# Patient Record
Sex: Male | Born: 1970
Health system: Southern US, Community
[De-identification: ages and names within clinical notes are randomized; demographics above are authoritative.]

---

## 2009-02-09 ENCOUNTER — Ambulatory Visit: Payer: Self-pay | Admitting: Internal Medicine

## 2009-03-09 LAB — COMPREHENSIVE METABOLIC PANEL
Albumin: 4.5 g/dL (ref 3.5–5.2)
BUN: 20 mg/dL (ref 6–23)
CO2: 27 mEq/L (ref 19–32)
Glucose, Bld: 95 mg/dL (ref 70–99)
Potassium: 4.3 mEq/L (ref 3.5–5.3)
Sodium: 140 mEq/L (ref 135–145)
Total Bilirubin: 0.6 mg/dL (ref 0.3–1.2)
Total Protein: 6.8 g/dL (ref 6.0–8.3)

## 2009-03-09 LAB — CBC WITH DIFFERENTIAL/PLATELET
BASO%: 0.2 % (ref 0.0–2.0)
HCT: 41.4 % (ref 38.4–49.9)
LYMPH%: 18.3 % (ref 14.0–49.0)
MCH: 31.6 pg (ref 27.2–33.4)
MCHC: 34.2 g/dL (ref 32.0–36.0)
MCV: 92.3 fL (ref 79.3–98.0)
MONO#: 0.6 10*3/uL (ref 0.1–0.9)
NEUT%: 68.8 % (ref 39.0–75.0)
Platelets: 133 10*3/uL — ABNORMAL LOW (ref 140–400)
WBC: 6.5 10*3/uL (ref 4.0–10.3)

## 2009-04-05 ENCOUNTER — Ambulatory Visit (HOSPITAL_COMMUNITY): Admission: RE | Admit: 2009-04-05 | Discharge: 2009-04-05 | Payer: Self-pay | Admitting: Internal Medicine

## 2009-04-06 ENCOUNTER — Ambulatory Visit: Payer: Self-pay | Admitting: Internal Medicine

## 2009-04-19 LAB — CBC WITH DIFFERENTIAL/PLATELET
Basophils Absolute: 0 10*3/uL (ref 0.0–0.1)
Eosinophils Absolute: 0.4 10*3/uL (ref 0.0–0.5)
HCT: 40.3 % (ref 38.4–49.9)
HGB: 14 g/dL (ref 13.0–17.1)
LYMPH%: 26.9 % (ref 14.0–49.0)
MCV: 91.6 fL (ref 79.3–98.0)
MONO%: 10 % (ref 0.0–14.0)
NEUT#: 2.6 10*3/uL (ref 1.5–6.5)
NEUT%: 54.6 % (ref 39.0–75.0)
Platelets: 133 10*3/uL — ABNORMAL LOW (ref 140–400)

## 2009-10-07 ENCOUNTER — Ambulatory Visit: Payer: Self-pay | Admitting: Internal Medicine

## 2010-11-20 ENCOUNTER — Encounter: Payer: Self-pay | Admitting: Internal Medicine

## 2013-12-31 ENCOUNTER — Telehealth: Payer: Self-pay | Admitting: Hematology & Oncology

## 2013-12-31 NOTE — Telephone Encounter (Signed)
Spoke w NEW PATIENT today to remind them of their appointment with Dr. Ennever. Also, advised them to bring all meds and insurance information. ° °

## 2014-01-01 ENCOUNTER — Encounter: Payer: Self-pay | Admitting: Hematology & Oncology

## 2014-01-01 ENCOUNTER — Ambulatory Visit (HOSPITAL_BASED_OUTPATIENT_CLINIC_OR_DEPARTMENT_OTHER): Payer: 59 | Admitting: Hematology & Oncology

## 2014-01-01 ENCOUNTER — Ambulatory Visit: Payer: 59

## 2014-01-01 ENCOUNTER — Telehealth: Payer: Self-pay | Admitting: Hematology & Oncology

## 2014-01-01 ENCOUNTER — Other Ambulatory Visit (HOSPITAL_BASED_OUTPATIENT_CLINIC_OR_DEPARTMENT_OTHER): Payer: 59 | Admitting: Lab

## 2014-01-01 VITALS — BP 127/65 | HR 59 | Temp 98.3°F | Resp 18 | Ht 72.0 in | Wt 186.0 lb

## 2014-01-01 DIAGNOSIS — D61818 Other pancytopenia: Secondary | ICD-10-CM

## 2014-01-01 DIAGNOSIS — D72819 Decreased white blood cell count, unspecified: Secondary | ICD-10-CM

## 2014-01-01 DIAGNOSIS — D696 Thrombocytopenia, unspecified: Secondary | ICD-10-CM

## 2014-01-01 LAB — CBC WITH DIFFERENTIAL (CANCER CENTER ONLY)
BASO#: 0 10*3/uL (ref 0.0–0.2)
BASO%: 0.3 % (ref 0.0–2.0)
EOS%: 2.7 % (ref 0.0–7.0)
Eosinophils Absolute: 0.1 10*3/uL (ref 0.0–0.5)
HEMATOCRIT: 42 % (ref 38.7–49.9)
HGB: 13.8 g/dL (ref 13.0–17.1)
LYMPH#: 1.1 10*3/uL (ref 0.9–3.3)
LYMPH%: 29.6 % (ref 14.0–48.0)
MCH: 30.8 pg (ref 28.0–33.4)
MCHC: 32.9 g/dL (ref 32.0–35.9)
MCV: 94 fL (ref 82–98)
MONO#: 0.5 10*3/uL (ref 0.1–0.9)
MONO%: 12.8 % (ref 0.0–13.0)
NEUT#: 2 10*3/uL (ref 1.5–6.5)
NEUT%: 54.6 % (ref 40.0–80.0)
Platelets: 117 10*3/uL — ABNORMAL LOW (ref 145–400)
RBC: 4.48 10*6/uL (ref 4.20–5.70)
RDW: 12.3 % (ref 11.1–15.7)
WBC: 3.7 10*3/uL — ABNORMAL LOW (ref 4.0–10.0)

## 2014-01-01 LAB — CMP (CANCER CENTER ONLY)
ALK PHOS: 42 U/L (ref 26–84)
ALT(SGPT): 23 U/L (ref 10–47)
AST: 29 U/L (ref 11–38)
Albumin: 4 g/dL (ref 3.3–5.5)
BUN: 13 mg/dL (ref 7–22)
CO2: 30 mEq/L (ref 18–33)
CREATININE: 1 mg/dL (ref 0.6–1.2)
Calcium: 9.3 mg/dL (ref 8.0–10.3)
Chloride: 104 mEq/L (ref 98–108)
GLUCOSE: 85 mg/dL (ref 73–118)
Potassium: 3.7 mEq/L (ref 3.3–4.7)
Sodium: 137 mEq/L (ref 128–145)
Total Bilirubin: 0.7 mg/dl (ref 0.20–1.60)
Total Protein: 7 g/dL (ref 6.4–8.1)

## 2014-01-01 LAB — CHCC SATELLITE - SMEAR

## 2014-01-01 NOTE — Progress Notes (Signed)
Referral MD  Reason for Referral: Thrombo-cytopenia and leukopenia   Chief Complaint  Patient presents with  . NEW PATIENT  : I'm here because my abnormal blood counts  HPI: Mr. Aaron Ortega is a very nice 43 year old gentleman. He works for [employer]. He is originally from [place]. Is on talking to him about [place] as I did my training in [place]   He had an insurance physical. Apparently blood work was abnormal. He's not yet heard what else was abnormal.  He saw his family doctor. Lab work was done. This was done in January. His white cell count was 3.7. Hemoglobin 14.4. Platelet count was 138.  Going through past lab work, and everything was normal about 45 years ago.  He's had no change in medications. He really does not take medications. He is does not smoke. He does have some alcohol on occasion. He has not traveled recently.  There is no past surgery. He's had no transfusions. There is no risk factors for HIV or hepatitis.  Is not a vegetarian. He's had no change in bowel or bladder habits. He's had no rashes. He's had no cough. There's no swollen lymph nodes. There is no joint swelling or redness.  Thank you is currently referred to the [facility] for evaluation.   No past medical history on file.:  No past surgical history on file.:  Current outpatient prescriptions:Multiple Vitamins-Minerals (MULTIVITAMIN PO), Take by mouth every morning., Disp: , Rfl: :  :  No Known Allergies:  No family history on file.:  History   Social History  . Marital Status: Single    Spouse Name: N/A    Number of Children: N/A  . Years of Education: N/A   Occupational History  . Not on file.   Social History Main Topics  . Smoking status: Never Smoker   . Smokeless tobacco: Never Used     Comment: never used tobacco  . Alcohol Use: Not on file  . Drug Use: Not on file  . Sexual Activity: Not on file   Other Topics Concern  . Not on file   Social  History Narrative  . No narrative on file  :  Pertinent items are noted in HPI.  Exam: @IPVITALS @  well-developed well-nourished gentleman. Vital signs are stable. He is afebrile. He has no adenopathy. Lungs are clear. Cardiac exam regular rate and rhythm. Skin no rashes. Abdomen soft. No palpable liver or spleen. Inguinal exam is negative. Back exam no tenderness over the spine. Skin exam shows some hyperpigmented lesions. He has seen a dermatologist. No malignant lesions have been removed. Extremities shows no clubbing cyanosis or edema. Oral exam is negative. Thyroid nonpalpable.    Recent Labs  01/01/14 1205  WBC 3.7*  HGB 13.8  HCT 42.0  PLT 117*    Recent Labs  01/01/14 1205  NA 137  K 3.7  CL 104  CO2 30  GLUCOSE 85  BUN 13  CREATININE 1.0  CALCIUM 9.3    Blood smear review: Normochromic normocytic red cells. There are no nucleated red cells. There are no teardrop cells. I see no schistocytes and there are no spherocytes. I see no rouleau formation. White cells appear normal in morphology maturation. There is no atypical lymphocytes. There are no hyper segmented polys. There is no immature myeloid cells. No blasts are noted. Platelets are mildly decreased. Platelets are well regulated. He has several large platelets  Pathology: None     Assessment and Plan: 43 year old gentleman.  He's been in excellent health. He has minimal leukopenia and mild thrombocytopenia.  I cannot find anything on his physical exam that would suggest an underlying malignancy. His blood smear certainly is unremarkable for any obvious bone marrow disorder. I cannot find anything that would suggest a hematologic malignancy. I don't see anything that really looks like myelo dysplasia. I certainly do not believe there is leukemia.  I don't find anything that would suggest a collagen vascular disease.  It is clearly possible that he may have mild immune thrombocytopenia. This would certainly  would be logical.  Again there is no risk factors for HIV or hepatitis.  He does enjoy alcohol. Once it was postulated that it might be an alcohol effect on the bone marrow. However this would be unusual.  I think we can just follow him along. He's asymptomatic. I think that we can just be conservative and see how things look.  I spent a good hour with him. I went over his lab work. I gave him  my recommendations.  I would like to see him back in 3 months.

## 2014-01-01 NOTE — Telephone Encounter (Signed)
Mailed [phone] schedule

## 2014-04-02 ENCOUNTER — Ambulatory Visit (HOSPITAL_BASED_OUTPATIENT_CLINIC_OR_DEPARTMENT_OTHER): Payer: 59 | Admitting: Hematology & Oncology

## 2014-04-02 ENCOUNTER — Encounter: Payer: Self-pay | Admitting: Hematology & Oncology

## 2014-04-02 ENCOUNTER — Other Ambulatory Visit (HOSPITAL_BASED_OUTPATIENT_CLINIC_OR_DEPARTMENT_OTHER): Payer: 59 | Admitting: Lab

## 2014-04-02 VITALS — BP 160/64 | HR 57 | Temp 97.9°F | Resp 18 | Ht 72.0 in | Wt 183.0 lb

## 2014-04-02 DIAGNOSIS — D693 Immune thrombocytopenic purpura: Secondary | ICD-10-CM

## 2014-04-02 DIAGNOSIS — D61818 Other pancytopenia: Secondary | ICD-10-CM

## 2014-04-02 DIAGNOSIS — D696 Thrombocytopenia, unspecified: Secondary | ICD-10-CM

## 2014-04-02 LAB — CBC WITH DIFFERENTIAL (CANCER CENTER ONLY)
BASO#: 0 10*3/uL (ref 0.0–0.2)
BASO%: 0.5 % (ref 0.0–2.0)
EOS%: 2.5 % (ref 0.0–7.0)
Eosinophils Absolute: 0.1 10*3/uL (ref 0.0–0.5)
HEMATOCRIT: 40.7 % (ref 38.7–49.9)
HEMOGLOBIN: 14 g/dL (ref 13.0–17.1)
LYMPH#: 1.3 10*3/uL (ref 0.9–3.3)
LYMPH%: 28.6 % (ref 14.0–48.0)
MCH: 31.9 pg (ref 28.0–33.4)
MCHC: 34.4 g/dL (ref 32.0–35.9)
MCV: 93 fL (ref 82–98)
MONO#: 0.5 10*3/uL (ref 0.1–0.9)
MONO%: 10.1 % (ref 0.0–13.0)
NEUT#: 2.6 10*3/uL (ref 1.5–6.5)
NEUT%: 58.3 % (ref 40.0–80.0)
Platelets: 135 10*3/uL — ABNORMAL LOW (ref 145–400)
RBC: 4.39 10*6/uL (ref 4.20–5.70)
RDW: 12 % (ref 11.1–15.7)
WBC: 4.4 10*3/uL (ref 4.0–10.0)

## 2014-04-02 LAB — CHCC SATELLITE - SMEAR

## 2014-04-02 NOTE — Progress Notes (Signed)
Hematology and Oncology Follow Up Visit  Aaron Ortega [id number] [birth year] 43 y.o. 04/02/2014   Principle Diagnosis:   Thrombocytopenia-likely mild chronic immune-based  Current Therapy:    Observation     Interim History:  Mr.  Aaron Ortega is back for his second office visit. We first saw him back in March. At that point in time, all his labs looked good. There is no obvious etiology for the thrombocytopenia. He has been a symptomatic. He's had no bleeding. There's been no bruising. He's had no cough. He's had no rashes. He has had no change in bowel or bladder habits.  The no weight loss or weight gain. His appetite has been good. He is working full-time.        Medications: Current outpatient prescriptions:Multiple Vitamins-Minerals (MULTIVITAMIN PO), Take by mouth every morning., Disp: , Rfl:   Allergies: No Known Allergies  Past Medical History, Surgical history, Social history, and Family History were reviewed and updated.  Review of Systems: As above  Physical Exam:  height is 6' (1.829 m) and weight is 183 lb (83.008 kg). His oral temperature is 97.9 F (36.6 C). His blood pressure is 160/64 and his pulse is 57. His respiration is 18.   Well-developed and well-nourished white male . Head and neck exam shows no ocular or oral lesions. He is no palpable cervical or super clavicular lymph nodes. Lungs are clear. Cardiac exam regular rate rhythm with no murmurs. Abdomen soft. There is no palpable liver or spleen. Back exam no tenderness over the spine ribs or hips. Skin exam no rashes. Neurological exam no focal findings. Lab Results  Component Value Date   WBC 4.4 04/02/2014   HGB 14.0 04/02/2014   HCT 40.7 04/02/2014   MCV 93 04/02/2014   PLT 135 Platelet count consistent in citrate* 04/02/2014     Chemistry      Component Value Date/Time   NA 137 01/01/2014 1205   NA 140 03/09/2009 0940   K 3.7 01/01/2014 1205   K 4.3 03/09/2009 0940   CL 104 01/01/2014 1205   CL 105 03/09/2009  0940   CO2 30 01/01/2014 1205   CO2 27 03/09/2009 0940   BUN 13 01/01/2014 1205   BUN 20 03/09/2009 0940   CREATININE 1.0 01/01/2014 1205   CREATININE 0.88 03/09/2009 0940      Component Value Date/Time   CALCIUM 9.3 01/01/2014 1205   CALCIUM 9.4 03/09/2009 0940   ALKPHOS 42 01/01/2014 1205   ALKPHOS 39 03/09/2009 0940   AST 29 01/01/2014 1205   AST 41* 03/09/2009 0940   ALT 23 01/01/2014 1205   ALT 31 03/09/2009 0940   BILITOT 0.70 01/01/2014 1205   BILITOT 0.6 03/09/2009 0940         Impression and Plan: Mr. Aaron Ortega is 43 year old gentleman. His mild thrombocytopenia. He is totally asymptomatic. His platelet count is better today. I just think that this will fluctuate.  I would think that he has a mild chronic immune cytopenia. He does not need any further intervention. He did not need a bone marrow test. I don't see a need for any scans.  I really think that we can let him go from the practice. If there are any problems in the future, I really would be more the happy to see him back.  I reviewed his lab work with him. He feels good about not having to come back. Volanda Napoleon, MD 6/4/20156:37 PM

## 2017-05-04 DIAGNOSIS — S76312A Strain of muscle, fascia and tendon of the posterior muscle group at thigh level, left thigh, initial encounter: Secondary | ICD-10-CM | POA: Diagnosis not present

## 2017-05-04 DIAGNOSIS — M9901 Segmental and somatic dysfunction of cervical region: Secondary | ICD-10-CM | POA: Diagnosis not present

## 2017-05-04 DIAGNOSIS — M9902 Segmental and somatic dysfunction of thoracic region: Secondary | ICD-10-CM | POA: Diagnosis not present

## 2017-06-14 DIAGNOSIS — M9903 Segmental and somatic dysfunction of lumbar region: Secondary | ICD-10-CM | POA: Diagnosis not present

## 2017-06-14 DIAGNOSIS — M9901 Segmental and somatic dysfunction of cervical region: Secondary | ICD-10-CM | POA: Diagnosis not present

## 2017-06-14 DIAGNOSIS — M9902 Segmental and somatic dysfunction of thoracic region: Secondary | ICD-10-CM | POA: Diagnosis not present

## 2017-06-27 DIAGNOSIS — R509 Fever, unspecified: Secondary | ICD-10-CM | POA: Diagnosis not present

## 2017-06-27 DIAGNOSIS — J029 Acute pharyngitis, unspecified: Secondary | ICD-10-CM | POA: Diagnosis not present

## 2017-07-12 DIAGNOSIS — M9901 Segmental and somatic dysfunction of cervical region: Secondary | ICD-10-CM | POA: Diagnosis not present

## 2017-07-12 DIAGNOSIS — M9902 Segmental and somatic dysfunction of thoracic region: Secondary | ICD-10-CM | POA: Diagnosis not present

## 2017-07-12 DIAGNOSIS — M9903 Segmental and somatic dysfunction of lumbar region: Secondary | ICD-10-CM | POA: Diagnosis not present

## 2017-08-16 DIAGNOSIS — M9901 Segmental and somatic dysfunction of cervical region: Secondary | ICD-10-CM | POA: Diagnosis not present

## 2017-08-16 DIAGNOSIS — M9903 Segmental and somatic dysfunction of lumbar region: Secondary | ICD-10-CM | POA: Diagnosis not present

## 2017-08-16 DIAGNOSIS — M9902 Segmental and somatic dysfunction of thoracic region: Secondary | ICD-10-CM | POA: Diagnosis not present

## 2017-09-18 DIAGNOSIS — M9902 Segmental and somatic dysfunction of thoracic region: Secondary | ICD-10-CM | POA: Diagnosis not present

## 2017-09-18 DIAGNOSIS — M9901 Segmental and somatic dysfunction of cervical region: Secondary | ICD-10-CM | POA: Diagnosis not present

## 2017-09-18 DIAGNOSIS — M9903 Segmental and somatic dysfunction of lumbar region: Secondary | ICD-10-CM | POA: Diagnosis not present

## 2017-10-18 DIAGNOSIS — M9902 Segmental and somatic dysfunction of thoracic region: Secondary | ICD-10-CM | POA: Diagnosis not present

## 2017-10-18 DIAGNOSIS — M9901 Segmental and somatic dysfunction of cervical region: Secondary | ICD-10-CM | POA: Diagnosis not present

## 2017-10-18 DIAGNOSIS — M9903 Segmental and somatic dysfunction of lumbar region: Secondary | ICD-10-CM | POA: Diagnosis not present

## 2018-02-02 DIAGNOSIS — J1189 Influenza due to unidentified influenza virus with other manifestations: Secondary | ICD-10-CM | POA: Diagnosis not present

## 2018-03-06 DIAGNOSIS — J309 Allergic rhinitis, unspecified: Secondary | ICD-10-CM | POA: Diagnosis not present

## 2018-03-06 DIAGNOSIS — R030 Elevated blood-pressure reading, without diagnosis of hypertension: Secondary | ICD-10-CM | POA: Diagnosis not present

## 2018-03-29 DIAGNOSIS — Z0001 Encounter for general adult medical examination with abnormal findings: Secondary | ICD-10-CM | POA: Diagnosis not present

## 2018-03-29 DIAGNOSIS — R030 Elevated blood-pressure reading, without diagnosis of hypertension: Secondary | ICD-10-CM | POA: Diagnosis not present

## 2018-04-02 DIAGNOSIS — D2239 Melanocytic nevi of other parts of face: Secondary | ICD-10-CM | POA: Diagnosis not present

## 2018-04-02 DIAGNOSIS — L570 Actinic keratosis: Secondary | ICD-10-CM | POA: Diagnosis not present

## 2018-04-12 DIAGNOSIS — L570 Actinic keratosis: Secondary | ICD-10-CM | POA: Diagnosis not present

## 2018-07-18 DIAGNOSIS — H5213 Myopia, bilateral: Secondary | ICD-10-CM | POA: Diagnosis not present

## 2018-09-24 DIAGNOSIS — M9902 Segmental and somatic dysfunction of thoracic region: Secondary | ICD-10-CM | POA: Diagnosis not present

## 2018-09-24 DIAGNOSIS — M9901 Segmental and somatic dysfunction of cervical region: Secondary | ICD-10-CM | POA: Diagnosis not present

## 2018-09-24 DIAGNOSIS — M9903 Segmental and somatic dysfunction of lumbar region: Secondary | ICD-10-CM | POA: Diagnosis not present

## 2019-01-28 DIAGNOSIS — Z713 Dietary counseling and surveillance: Secondary | ICD-10-CM | POA: Diagnosis not present

## 2019-03-03 DIAGNOSIS — Z713 Dietary counseling and surveillance: Secondary | ICD-10-CM | POA: Diagnosis not present

## 2019-03-04 DIAGNOSIS — D225 Melanocytic nevi of trunk: Secondary | ICD-10-CM | POA: Diagnosis not present

## 2019-03-04 DIAGNOSIS — L905 Scar conditions and fibrosis of skin: Secondary | ICD-10-CM | POA: Diagnosis not present

## 2019-03-04 DIAGNOSIS — L280 Lichen simplex chronicus: Secondary | ICD-10-CM | POA: Diagnosis not present

## 2019-03-04 DIAGNOSIS — L814 Other melanin hyperpigmentation: Secondary | ICD-10-CM | POA: Diagnosis not present

## 2019-03-04 DIAGNOSIS — D485 Neoplasm of uncertain behavior of skin: Secondary | ICD-10-CM | POA: Diagnosis not present

## 2019-03-04 DIAGNOSIS — L578 Other skin changes due to chronic exposure to nonionizing radiation: Secondary | ICD-10-CM | POA: Diagnosis not present

## 2019-03-07 DIAGNOSIS — M9903 Segmental and somatic dysfunction of lumbar region: Secondary | ICD-10-CM | POA: Diagnosis not present

## 2019-03-07 DIAGNOSIS — M9901 Segmental and somatic dysfunction of cervical region: Secondary | ICD-10-CM | POA: Diagnosis not present

## 2019-03-07 DIAGNOSIS — M9902 Segmental and somatic dysfunction of thoracic region: Secondary | ICD-10-CM | POA: Diagnosis not present

## 2019-03-07 DIAGNOSIS — M9905 Segmental and somatic dysfunction of pelvic region: Secondary | ICD-10-CM | POA: Diagnosis not present

## 2019-09-24 ENCOUNTER — Other Ambulatory Visit: Payer: Self-pay

## 2019-09-24 ENCOUNTER — Emergency Department (HOSPITAL_COMMUNITY): Payer: 59

## 2019-09-24 ENCOUNTER — Encounter (HOSPITAL_COMMUNITY): Payer: Self-pay | Admitting: Emergency Medicine

## 2019-09-24 ENCOUNTER — Emergency Department (HOSPITAL_COMMUNITY)
Admission: EM | Admit: 2019-09-24 | Discharge: 2019-09-24 | Disposition: A | Discharge status: Home / Self Care | Payer: 59 | Attending: Emergency Medicine | Admitting: Emergency Medicine

## 2019-09-24 DIAGNOSIS — S2241XA Multiple fractures of ribs, right side, initial encounter for closed fracture: Secondary | ICD-10-CM

## 2019-09-24 DIAGNOSIS — D3501 Benign neoplasm of right adrenal gland: Secondary | ICD-10-CM | POA: Diagnosis not present

## 2019-09-24 DIAGNOSIS — S2241XD Multiple fractures of ribs, right side, subsequent encounter for fracture with routine healing: Secondary | ICD-10-CM | POA: Diagnosis not present

## 2019-09-24 DIAGNOSIS — J90 Pleural effusion, not elsewhere classified: Secondary | ICD-10-CM | POA: Diagnosis not present

## 2019-09-24 LAB — BASIC METABOLIC PANEL
Anion gap: 9 (ref 5–15)
BUN: 14 mg/dL (ref 6–20)
CO2: 25 mmol/L (ref 22–32)
Calcium: 8.6 mg/dL — ABNORMAL LOW (ref 8.9–10.3)
Chloride: 104 mmol/L (ref 98–111)
Creatinine, Ser: 0.9 mg/dL (ref 0.61–1.24)
GFR calc Af Amer: >60 mL/min (ref >60)
GFR calc non Af Amer: >60 mL/min (ref >60)
Glucose, Bld: 108 mg/dL — ABNORMAL HIGH (ref 70–99)
Potassium: 4 mmol/L (ref 3.5–5.1)
Sodium: 138 mmol/L (ref 135–145)

## 2019-09-24 LAB — CBC WITH DIFFERENTIAL/PLATELET
Abs Immature Granulocytes: 0.02 10*3/uL (ref 0.00–0.07)
Basophils Absolute: 0.1 10*3/uL (ref 0.0–0.1)
Basophils Relative: 1 %
Eosinophils Absolute: 1.5 10*3/uL — ABNORMAL HIGH (ref 0.0–0.5)
Eosinophils Relative: 21 %
HCT: 48.7 % (ref 39.0–52.0)
Hemoglobin: 15.7 g/dL (ref 13.0–17.0)
Immature Granulocytes: 0 %
Lymphocytes Relative: 14 %
Lymphs Abs: 1 10*3/uL (ref 0.7–4.0)
MCH: 30.6 pg (ref 26.0–34.0)
MCHC: 32.2 g/dL (ref 30.0–36.0)
MCV: 94.9 fL (ref 80.0–100.0)
Monocytes Absolute: 1 10*3/uL (ref 0.1–1.0)
Monocytes Relative: 14 %
Neutro Abs: 3.7 10*3/uL (ref 1.7–7.7)
Neutrophils Relative %: 50 %
Platelets: 227 10*3/uL (ref 150–400)
RBC: 5.13 MIL/uL (ref 4.22–5.81)
RDW: 12.3 % (ref 11.5–15.5)
WBC: 7.3 10*3/uL (ref 4.0–10.5)
nRBC: 0 % (ref 0.0–0.2)

## 2019-09-24 MED ORDER — IOHEXOL 300 MG/ML  SOLN
75.0000 mL | Freq: Once | INTRAMUSCULAR | Status: AC | PRN
  Administered 2019-09-24: 75 mL via INTRAVENOUS

## 2019-09-24 MED ORDER — IOHEXOL 300 MG/ML  SOLN
100.0000 mL | Freq: Once | INTRAMUSCULAR | Status: AC | PRN
  Administered 2019-09-24: 100 mL via INTRAVENOUS

## 2019-09-24 NOTE — ED Notes (Signed)
Pt returned to CT at this time
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
## 2019-09-24 NOTE — Discharge Instructions (Signed)
You were seen in the emergency department today with enlarging fluid collection in the right lung.  At this point, this will require surgery for removal.  I have listed the phone number for Dr. Caffie Pinto.  Please call the office first thing Monday to schedule an appointment.  He will make the office staff aware that you will be calling to schedule and they can get you in to schedule the surgery.  If your symptoms significantly worsen in the coming days you should return to the emergency department.   The CT scan also showed a benign lesion in the liver.  We also found what appears to be an adrenal adenoma lesion above the kidney.  The radiologist is recommending

## 2019-09-30 ENCOUNTER — Other Ambulatory Visit: Payer: Self-pay | Admitting: Surgery

## 2019-09-30 DIAGNOSIS — J90 Pleural effusion, not elsewhere classified: Secondary | ICD-10-CM

## 2019-10-01 ENCOUNTER — Ambulatory Visit
Admission: RE | Admit: 2019-10-01 | Discharge: 2019-10-01 | Disposition: A | Discharge status: Home / Self Care | Payer: 59 | Source: Ambulatory Visit | Attending: Surgery | Admitting: Surgery

## 2019-10-01 ENCOUNTER — Encounter: Payer: Self-pay | Admitting: Surgery

## 2019-10-01 ENCOUNTER — Other Ambulatory Visit: Payer: Self-pay

## 2019-10-01 ENCOUNTER — Other Ambulatory Visit: Payer: Self-pay | Admitting: *Deleted

## 2019-10-01 ENCOUNTER — Institutional Professional Consult (permissible substitution): Payer: 59 | Admitting: Surgery

## 2019-10-01 VITALS — BP 132/90 | HR 100 | Temp 97.8°F | Resp 20 | Ht 72.0 in | Wt 211.0 lb

## 2019-10-01 DIAGNOSIS — J90 Pleural effusion, not elsewhere classified: Secondary | ICD-10-CM | POA: Diagnosis not present
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
## 2019-10-04 ENCOUNTER — Other Ambulatory Visit (HOSPITAL_COMMUNITY)
Admission: RE | Admit: 2019-10-04 | Discharge: 2019-10-04 | Disposition: A | Discharge status: Home / Self Care | Payer: 59 | Source: Ambulatory Visit | Attending: Surgery | Admitting: Surgery

## 2019-10-04 DIAGNOSIS — J90 Pleural effusion, not elsewhere classified: Secondary | ICD-10-CM

## 2019-10-04 DIAGNOSIS — Z01812 Encounter for preprocedural laboratory examination: Secondary | ICD-10-CM | POA: Insufficient documentation

## 2019-10-04 DIAGNOSIS — U071 COVID-19: Secondary | ICD-10-CM | POA: Diagnosis not present

## 2019-10-05 LAB — SARS CORONAVIRUS 2 (TAT 6-24 HRS): SARS Coronavirus 2: POSITIVE — AB

## 2019-10-05 NOTE — Progress Notes (Signed)
Dr. Orvan Seen from the on-call service for Dr. Gilford Raid made aware of pt's positive covid result.

## 2019-10-06 ENCOUNTER — Other Ambulatory Visit: Payer: Self-pay | Admitting: Surgery

## 2019-10-06 DIAGNOSIS — J90 Pleural effusion, not elsewhere classified: Secondary | ICD-10-CM

## 2019-10-07 ENCOUNTER — Inpatient Hospital Stay (HOSPITAL_COMMUNITY): Admission: RE | Admit: 2019-10-07 | Payer: 59 | Source: Ambulatory Visit

## 2019-10-15 ENCOUNTER — Other Ambulatory Visit: Payer: Self-pay | Admitting: Surgery

## 2019-10-15 DIAGNOSIS — Z9889 Other specified postprocedural states: Secondary | ICD-10-CM

## 2019-10-17 ENCOUNTER — Other Ambulatory Visit (HOSPITAL_COMMUNITY): Payer: 59

## 2019-10-18 ENCOUNTER — Other Ambulatory Visit (HOSPITAL_COMMUNITY)
Admission: RE | Admit: 2019-10-18 | Discharge: 2019-10-18 | Disposition: A | Discharge status: Home / Self Care | Payer: 59 | Source: Ambulatory Visit | Attending: Surgery | Admitting: Surgery

## 2019-10-18 DIAGNOSIS — Z01812 Encounter for preprocedural laboratory examination: Secondary | ICD-10-CM | POA: Insufficient documentation

## 2019-10-18 DIAGNOSIS — Z20828 Contact with and (suspected) exposure to other viral communicable diseases: Secondary | ICD-10-CM | POA: Insufficient documentation

## 2019-10-18 LAB — SARS CORONAVIRUS 2 (TAT 6-24 HRS): SARS Coronavirus 2: NEGATIVE

## 2019-10-21 ENCOUNTER — Other Ambulatory Visit: Payer: Self-pay

## 2019-10-21 ENCOUNTER — Ambulatory Visit (HOSPITAL_COMMUNITY)
Admission: RE | Admit: 2019-10-21 | Discharge: 2019-10-21 | Disposition: A | Discharge status: Home / Self Care | Payer: 59 | Source: Ambulatory Visit | Attending: Surgery | Admitting: Surgery

## 2019-10-21 ENCOUNTER — Other Ambulatory Visit: Payer: Self-pay | Admitting: Surgery

## 2019-10-21 DIAGNOSIS — J90 Pleural effusion, not elsewhere classified: Secondary | ICD-10-CM

## 2019-10-21 HISTORY — PX: IR THORACENTESIS ASP PLEURAL SPACE W/IMG GUIDE: IMG5380

## 2019-10-21 MED ORDER — LIDOCAINE HCL 1 % IJ SOLN
INTRAMUSCULAR | Status: AC
  Filled 2019-10-21: qty 20

## 2019-10-21 MED ORDER — LIDOCAINE HCL 1 % IJ SOLN
INTRAMUSCULAR | Status: DC | PRN
  Administered 2019-10-21: 10 mL

## 2019-10-21 NOTE — Procedures (Signed)
PROCEDURE SUMMARY:  Successful US guided diagnostic and therapeutic right thoracentesis. Yielded 250 mL of blood-tinged fluid. Pt tolerated procedure well. No immediate complications.  Specimen was sent for labs. CXR ordered.  EBL < 5 mL  Docia Barrier PA-C 10/21/2019 2:59 PM

## 2019-10-22 ENCOUNTER — Ambulatory Visit: Payer: 59 | Admitting: Surgery

## 2019-10-22 LAB — CYTOLOGY - NON PAP

## 2019-11-10 ENCOUNTER — Other Ambulatory Visit: Payer: Self-pay | Admitting: Family Medicine

## 2019-11-10 DIAGNOSIS — D1803 Hemangioma of intra-abdominal structures: Secondary | ICD-10-CM

## 2019-11-10 DIAGNOSIS — E279 Disorder of adrenal gland, unspecified: Secondary | ICD-10-CM

## 2019-11-11 ENCOUNTER — Other Ambulatory Visit: Payer: Self-pay | Admitting: Family Medicine

## 2019-11-11 DIAGNOSIS — E279 Disorder of adrenal gland, unspecified: Secondary | ICD-10-CM

## 2019-11-11 DIAGNOSIS — D1803 Hemangioma of intra-abdominal structures: Secondary | ICD-10-CM

## 2019-12-01 ENCOUNTER — Other Ambulatory Visit: Payer: 59

## 2019-12-29 DIAGNOSIS — L814 Other melanin hyperpigmentation: Secondary | ICD-10-CM | POA: Diagnosis not present

## 2019-12-29 DIAGNOSIS — D225 Melanocytic nevi of trunk: Secondary | ICD-10-CM | POA: Diagnosis not present

## 2019-12-29 DIAGNOSIS — D485 Neoplasm of uncertain behavior of skin: Secondary | ICD-10-CM | POA: Diagnosis not present

## 2019-12-29 DIAGNOSIS — L578 Other skin changes due to chronic exposure to nonionizing radiation: Secondary | ICD-10-CM | POA: Diagnosis not present

## 2020-01-14 DIAGNOSIS — D1803 Hemangioma of intra-abdominal structures: Secondary | ICD-10-CM | POA: Diagnosis not present

## 2020-01-14 DIAGNOSIS — D1809 Hemangioma of other sites: Secondary | ICD-10-CM | POA: Diagnosis not present

## 2020-01-14 DIAGNOSIS — K7689 Other specified diseases of liver: Secondary | ICD-10-CM | POA: Diagnosis not present

## 2020-01-14 DIAGNOSIS — N281 Cyst of kidney, acquired: Secondary | ICD-10-CM | POA: Diagnosis not present

## 2020-01-14 DIAGNOSIS — E279 Disorder of adrenal gland, unspecified: Secondary | ICD-10-CM | POA: Diagnosis not present

## 2020-07-02 DIAGNOSIS — D485 Neoplasm of uncertain behavior of skin: Secondary | ICD-10-CM | POA: Diagnosis not present

## 2020-07-02 DIAGNOSIS — L814 Other melanin hyperpigmentation: Secondary | ICD-10-CM | POA: Diagnosis not present

## 2020-07-02 DIAGNOSIS — D225 Melanocytic nevi of trunk: Secondary | ICD-10-CM | POA: Diagnosis not present

## 2020-07-02 DIAGNOSIS — D2239 Melanocytic nevi of other parts of face: Secondary | ICD-10-CM | POA: Diagnosis not present

## 2020-08-27 DIAGNOSIS — H524 Presbyopia: Secondary | ICD-10-CM | POA: Diagnosis not present

## 2020-09-29 DIAGNOSIS — D582 Other hemoglobinopathies: Secondary | ICD-10-CM | POA: Diagnosis not present

## 2020-09-29 DIAGNOSIS — Z0000 Encounter for general adult medical examination without abnormal findings: Secondary | ICD-10-CM | POA: Diagnosis not present

## 2020-09-29 DIAGNOSIS — Z125 Encounter for screening for malignant neoplasm of prostate: Secondary | ICD-10-CM | POA: Diagnosis not present

## 2020-09-29 DIAGNOSIS — M25521 Pain in right elbow: Secondary | ICD-10-CM | POA: Diagnosis not present

## 2020-09-29 DIAGNOSIS — D696 Thrombocytopenia, unspecified: Secondary | ICD-10-CM | POA: Diagnosis not present

## 2020-09-29 DIAGNOSIS — I700 Atherosclerosis of aorta: Secondary | ICD-10-CM | POA: Diagnosis not present

## 2020-09-29 DIAGNOSIS — Z13220 Encounter for screening for lipoid disorders: Secondary | ICD-10-CM | POA: Diagnosis not present

## 2021-04-02 IMAGING — CT CT CHEST W/ CM
2 of 3 series · 15 of 36 positions shown, 18 images · IV contrast (Omni 300)
Comparison: None.

CLINICAL DATA: Pleural effusion with multiple rib fractures.

EXAM:
CT CHEST WITH CONTRAST
TECHNIQUE: Multidetector CT imaging of the chest was performed during
intravenous contrast administration.
CONTRAST:  75mL OMNIPAQUE IOHEXOL 300 MG/ML  SOLN

[Series 3: chest with 2mm st · axial · 0.78mm/px · z∈[+1284,+1564]mm · 12 of 166 slices shown, 15 images]
[im 13/166  mediastinal]
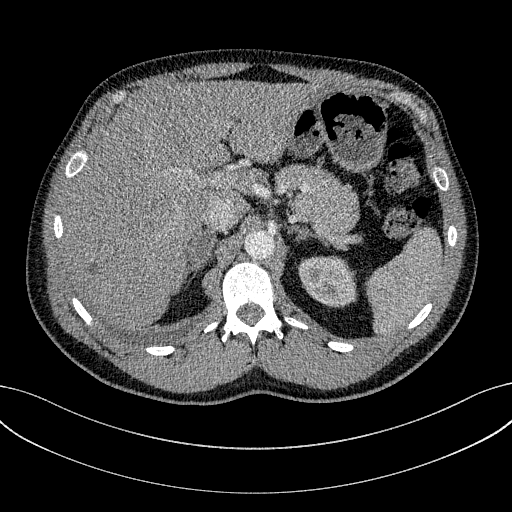
[im 13/166  lung]
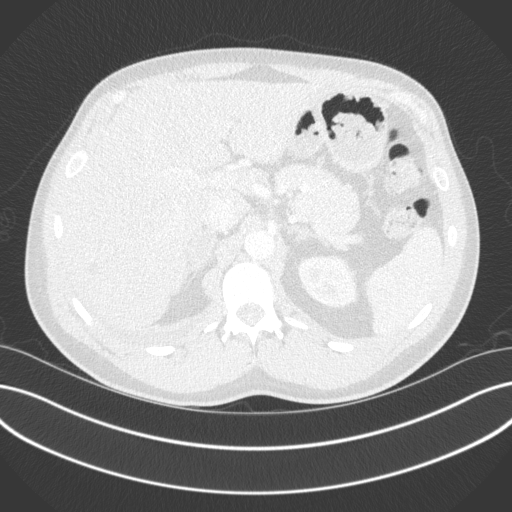
[im 25/166  lung]
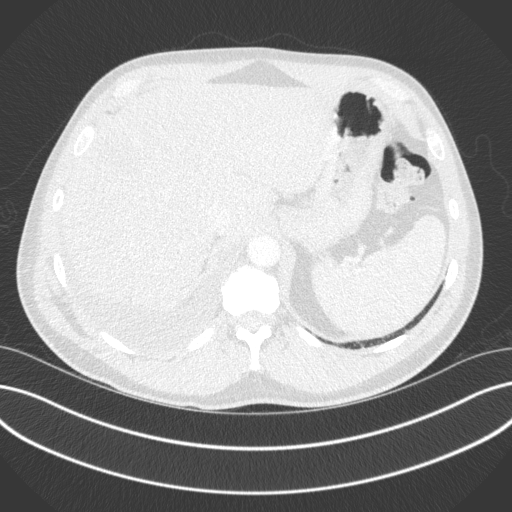
[im 37/166  lung]
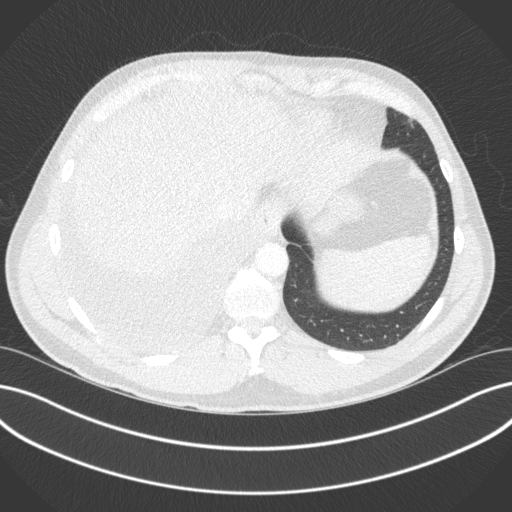
[im 49/166  lung]
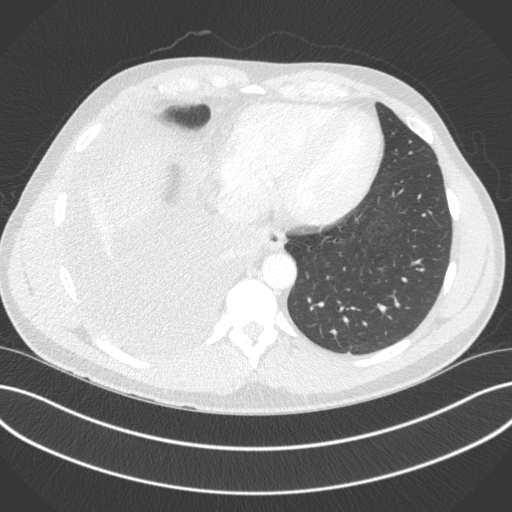
[im 62/166  mediastinal]
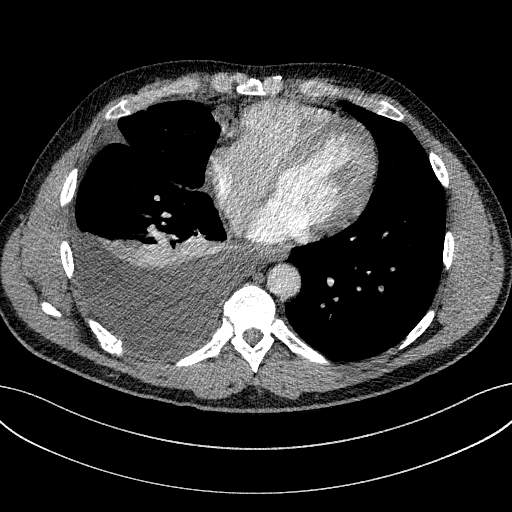
[im 62/166  lung]
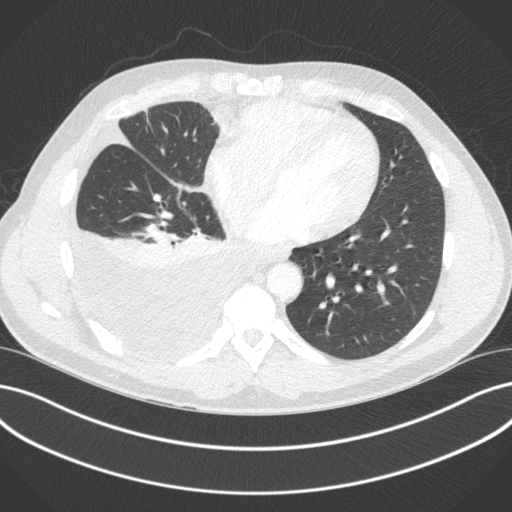
[im 74/166  lung]
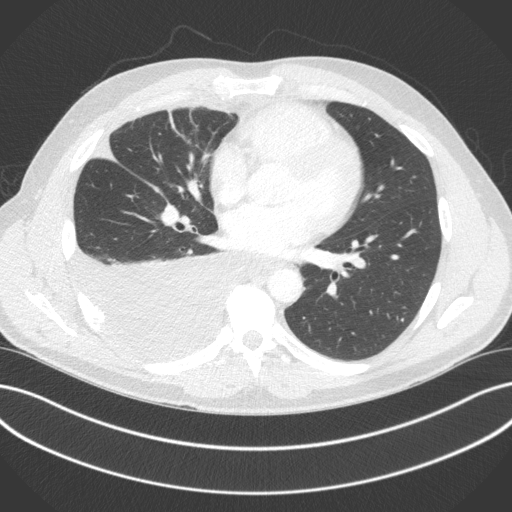
[im 92/166  lung]
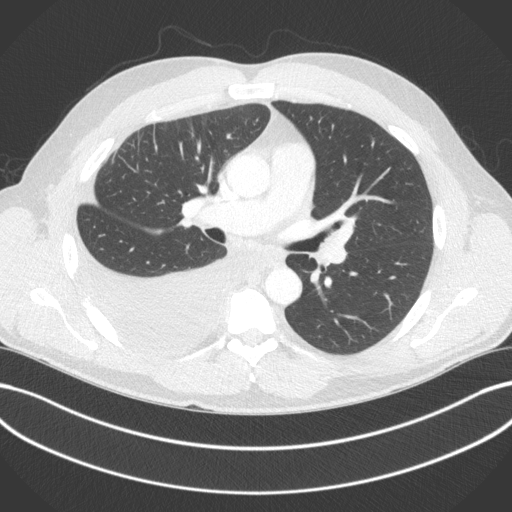
[im 104/166  lung]
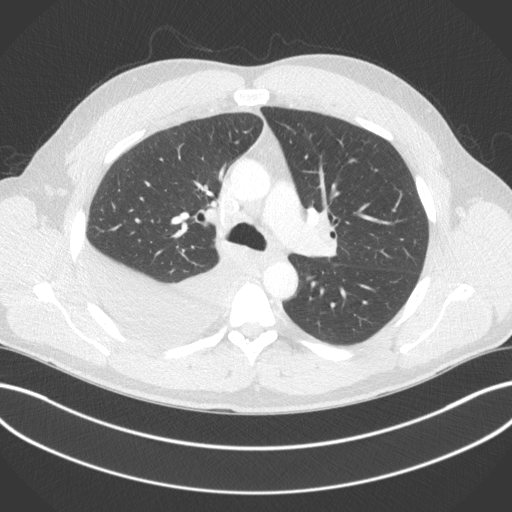
[im 117/166  mediastinal]
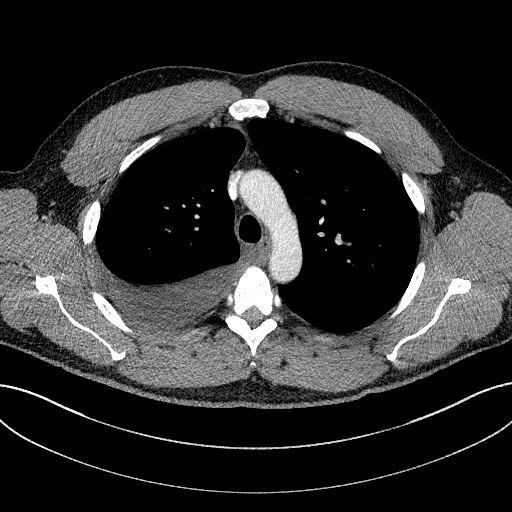
[im 117/166  lung]
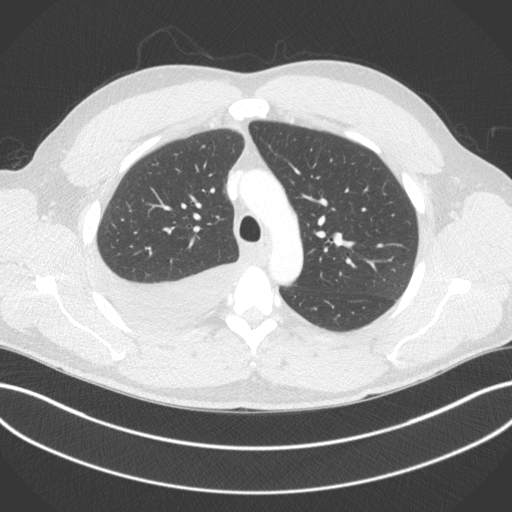
[im 129/166  lung]
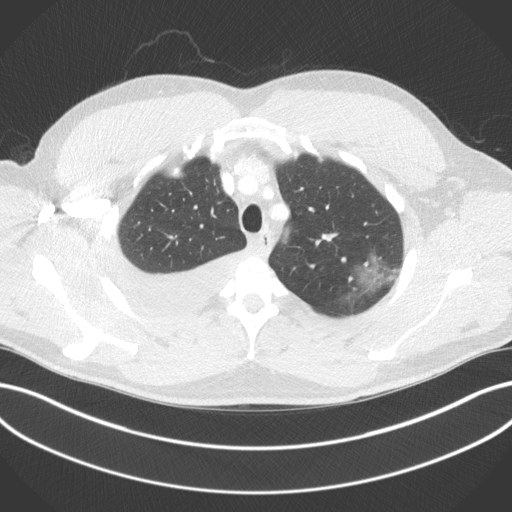
[im 141/166  lung]
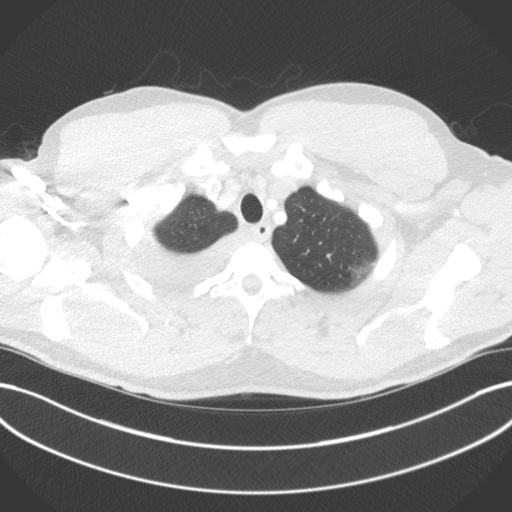
[im 153/166  lung]
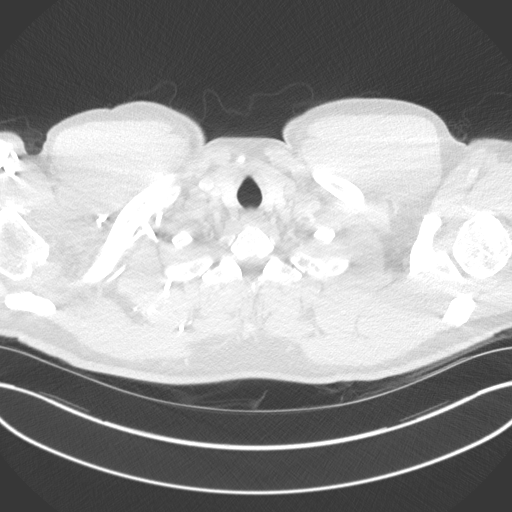

[Series 5: chest with 3mm st cor · coronal · 0.65mm/px · 3 of 100 slices shown]
[im 20/100  lung]
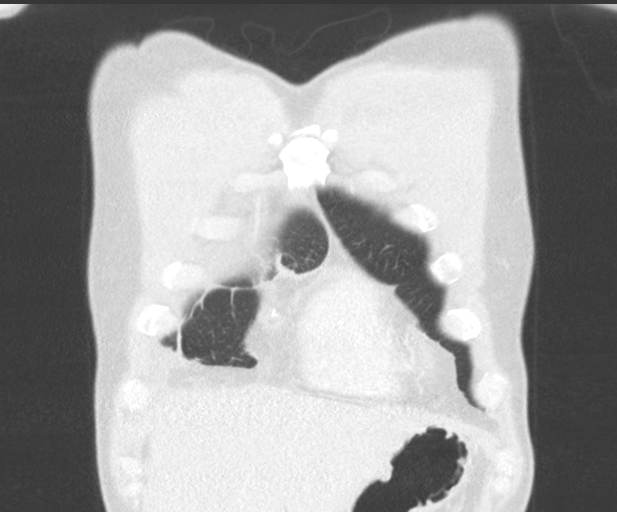
[im 40/100  lung]
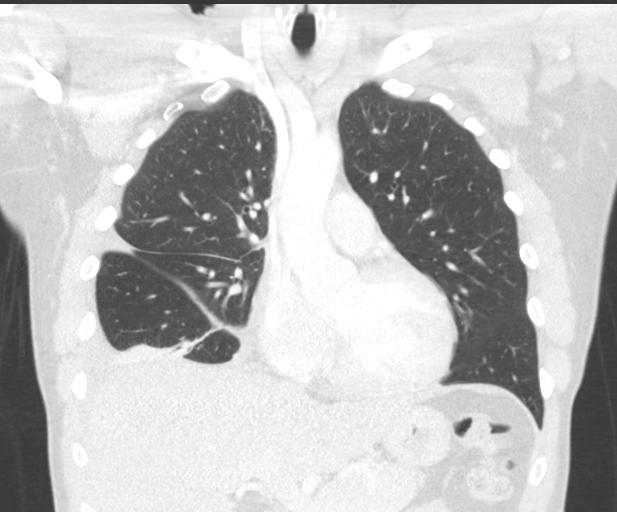
[im 60/100  lung]
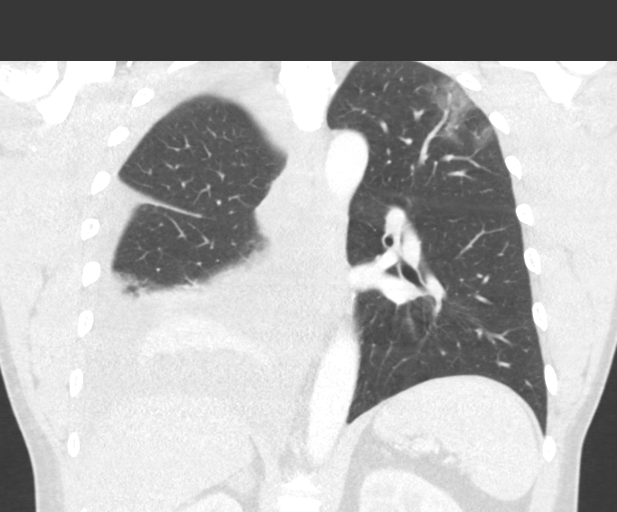

[15 of 36 positions shown; findings below may reference images not displayed]

FINDINGS: Cardiovascular: There is no obvious large centrally located
pulmonary embolism. The detection of smaller segmental and
subsegmental pulmonary emboli is severely limited by contrast bolus
timing. There is no thoracic aortic aneurysm. There are minimal
atherosclerotic changes of the thoracic aorta and coronary arteries.

Mediastinum/Nodes:

--No mediastinal or hilar lymphadenopathy.

--No axillary lymphadenopathy.

--No supraclavicular lymphadenopathy.

--Normal thyroid gland.

--The esophagus is unremarkable

Lungs/Pleura: Is a moderate large right-sided pleural effusion.
There is partial collapse of the right lower lobe. There is a
ground-glass airspace opacity. The trachea is unremarkable. There is
no pneumothorax.

Upper Abdomen: There is a low-attenuation area within the right
hepatic lobe measuring approximately 4.7 cm. There are additional
lower attenuation areas scattered throughout right hepatic lobe.
There is a 3 cm indeterminate right-sided adrenal nodule.

Musculoskeletal: There are multiple healing right-sided rib
fractures involving the seventh through eleventh ribs
posterolaterally on the right. There is no evidence healing
involving the eleventh rib. The majority the ribs are fractured both
posteriorly and laterally.

Review of the MIP images confirms the above findings.
IMPRESSION: 1. Moderate to large right-sided pleural effusion with partial
collapse of the right lower lobe.
2. Multiple healing right-sided rib fractures as detailed above. No
pneumothorax.
3. Indeterminate 4.7 cm hypoattenuating area in the right hepatic
lobe in addition to smaller subcentimeter hypoattenuating areas
throughout the right hepatic lobe. The larger lesion may represent a
healing laceration in the setting of trauma. However, these findings
are suboptimally evaluated on this exam. Follow-up with a
contrast-enhanced CT of the abdomen is recommended for further
evaluation.
4. Indeterminate 3 cm right adrenal nodule.
5. Ground-glass airspace opacity in the left upper lobe.
Differential considerations include a pulmonary contusion or
developing infectious process.

Aortic Atherosclerosis ([id number].A).

## 2021-04-09 IMAGING — CR DG CHEST 2V
2 series · 2 of 2 positions shown · non-contrast
Comparison: September 24, 2019

CLINICAL DATA: Right pleural effusion, broken right ribs

EXAM:
CHEST - 2 VIEW

[w chest pa]
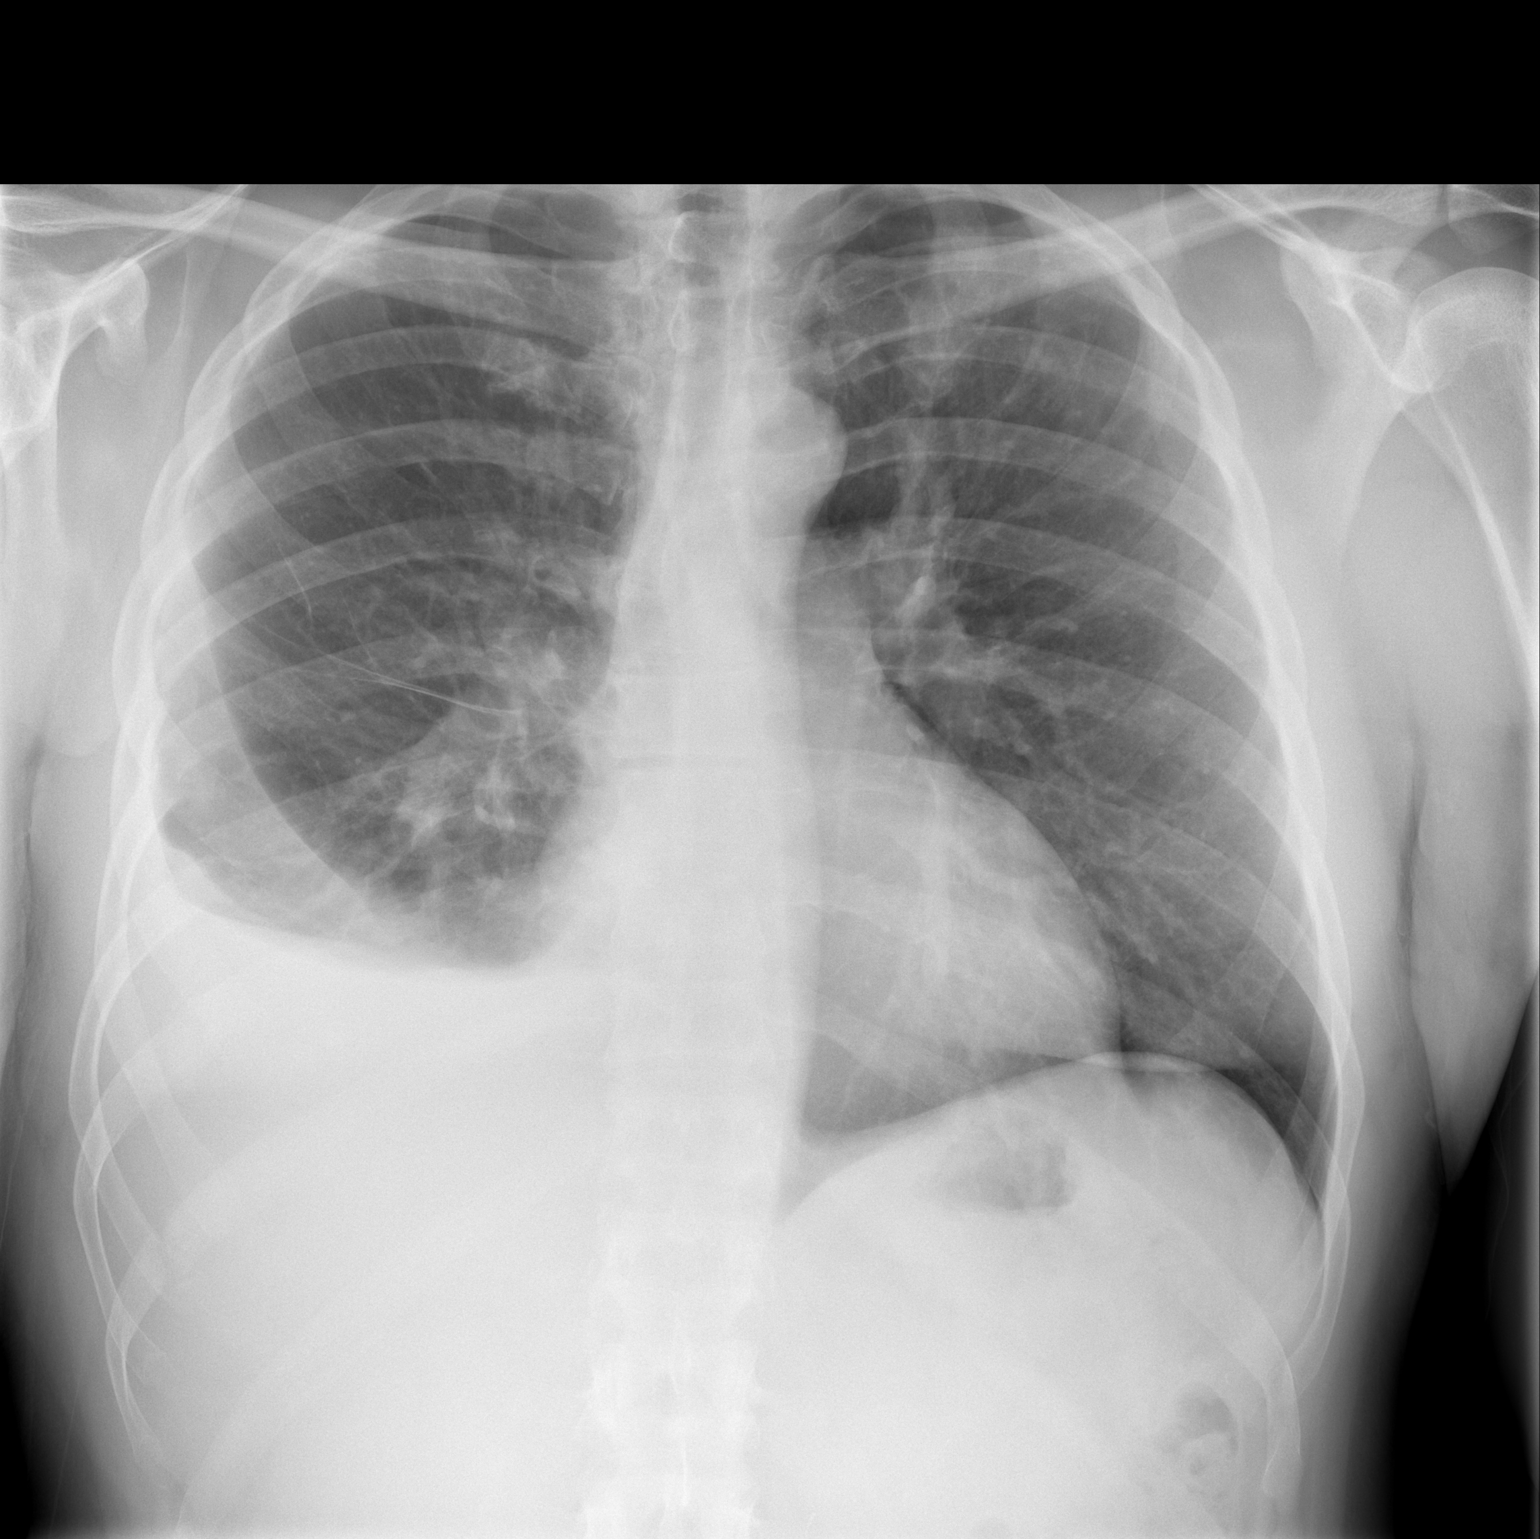

[w chest lat]
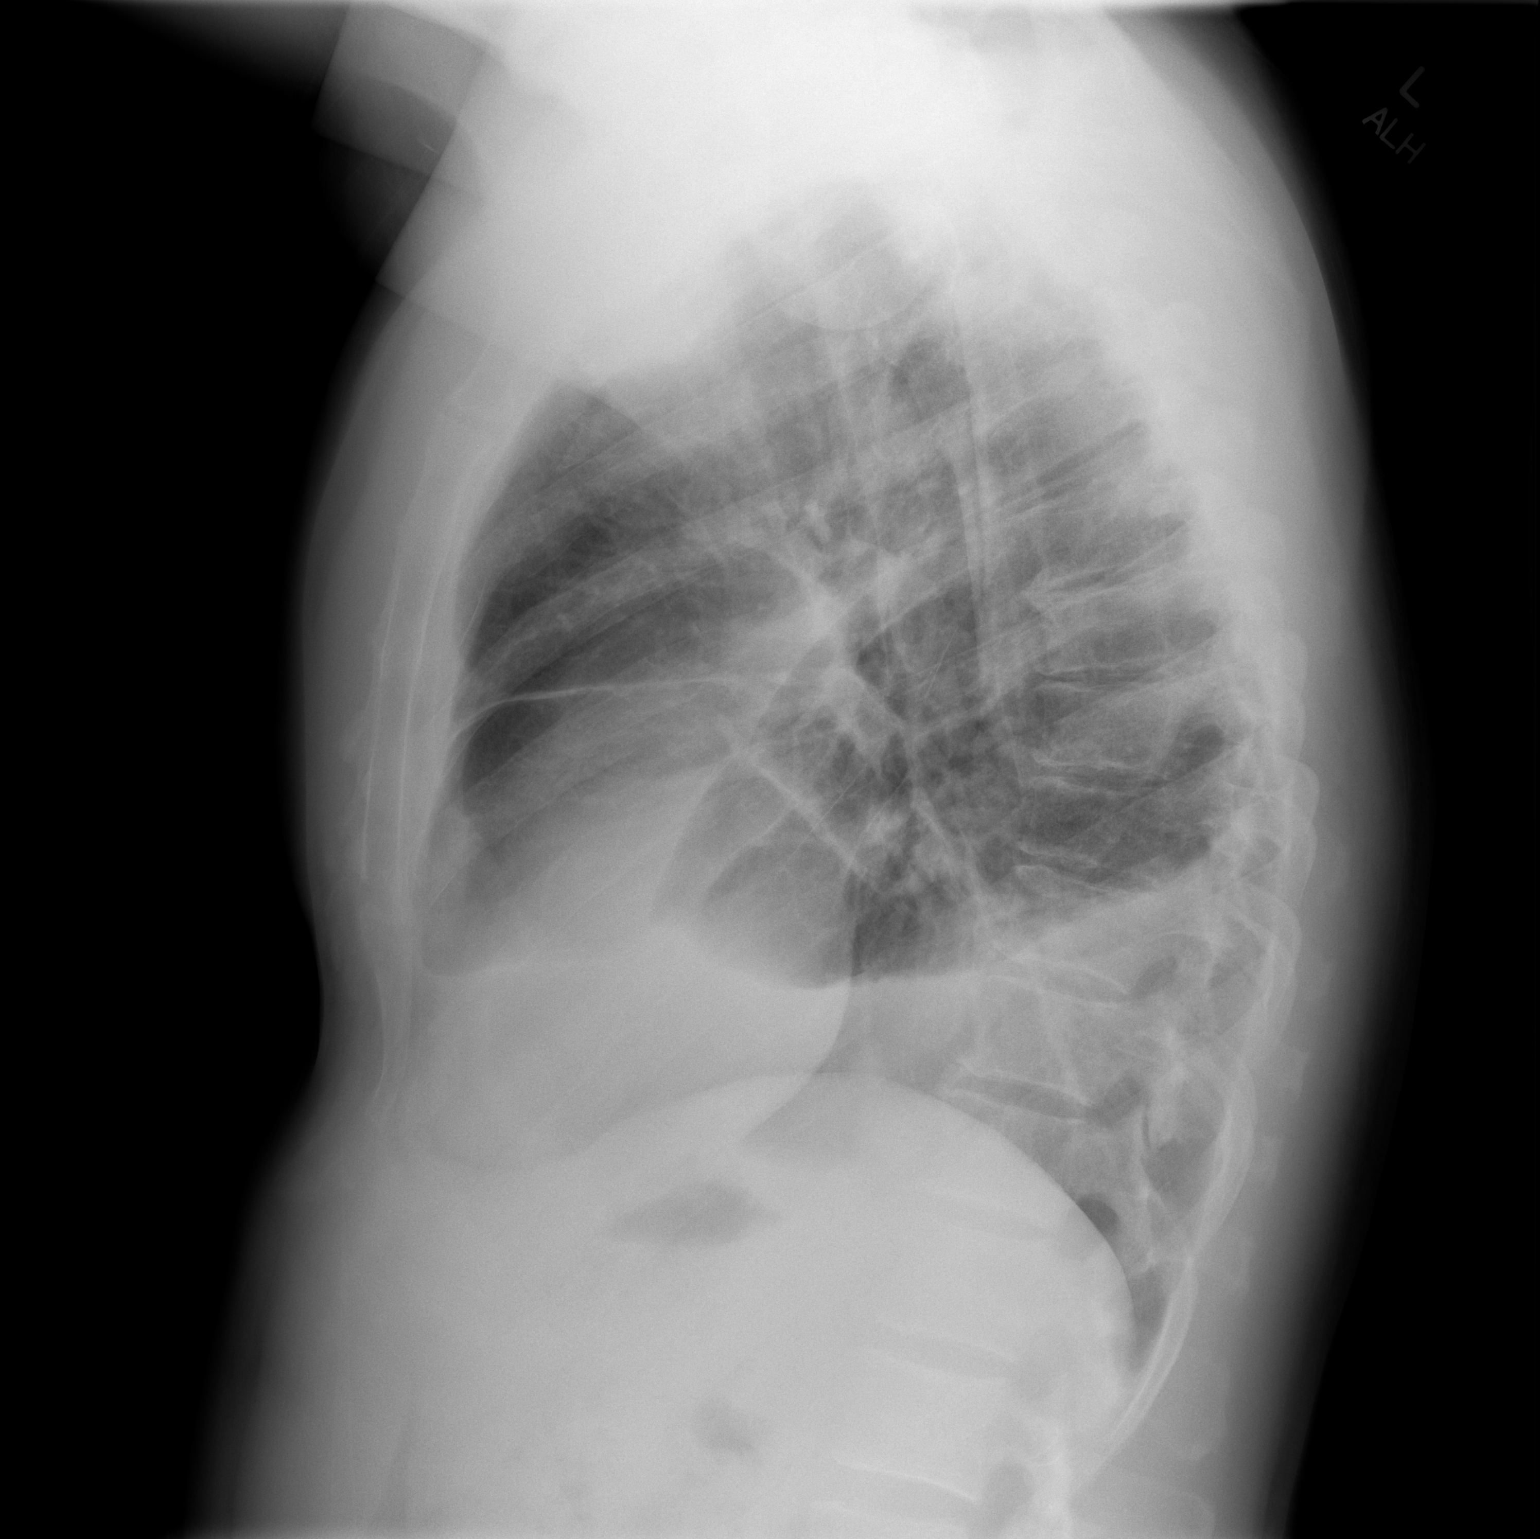

[2 of 2 positions shown; findings below may reference images not displayed]

FINDINGS: Persistent but decreased moderate right pleural effusion and
adjacent compressive atelectasis. No pneumothorax. Stable
cardiomediastinal contours. Right rib fractures again noted.
IMPRESSION: Decreased moderate right pleural effusion and adjacent atelectasis.
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
